# Patient Record
Sex: Female | Born: 1970 | Race: Black or African American | Hispanic: No | Smoking: Never smoker
Health system: Southern US, Community
[De-identification: ages and names within clinical notes are randomized; demographics above are authoritative.]

## PROBLEM LIST (undated history)

## (undated) DIAGNOSIS — R011 Cardiac murmur, unspecified: Secondary | ICD-10-CM

## (undated) DIAGNOSIS — G43909 Migraine, unspecified, not intractable, without status migrainosus: Secondary | ICD-10-CM

## (undated) DIAGNOSIS — N189 Chronic kidney disease, unspecified: Secondary | ICD-10-CM

## (undated) DIAGNOSIS — Z87442 Personal history of urinary calculi: Secondary | ICD-10-CM

## (undated) HISTORY — DX: Migraine, unspecified, not intractable, without status migrainosus: G43.909

## (undated) HISTORY — PX: KIDNEY STONE SURGERY: SHX686

## (undated) HISTORY — DX: Personal history of urinary calculi: Z87.442

## (undated) HISTORY — PX: WISDOM TOOTH EXTRACTION: SHX21

## (undated) HISTORY — DX: Cardiac murmur, unspecified: R01.1

---

## 2009-02-18 HISTORY — PX: BREAST SURGERY: SHX581

## 2014-02-03 ENCOUNTER — Encounter: Payer: Self-pay | Admitting: Internal Medicine

## 2014-02-03 ENCOUNTER — Ambulatory Visit (INDEPENDENT_AMBULATORY_CARE_PROVIDER_SITE_OTHER): Payer: BC Managed Care – PPO | Admitting: Internal Medicine

## 2014-02-03 VITALS — BP 142/74 | HR 78 | Temp 98.1°F | Ht 63.0 in | Wt 159.0 lb

## 2014-02-03 DIAGNOSIS — R030 Elevated blood-pressure reading, without diagnosis of hypertension: Secondary | ICD-10-CM | POA: Insufficient documentation

## 2014-02-03 DIAGNOSIS — Z1239 Encounter for other screening for malignant neoplasm of breast: Secondary | ICD-10-CM

## 2014-02-03 DIAGNOSIS — L0231 Cutaneous abscess of buttock: Secondary | ICD-10-CM

## 2014-02-03 DIAGNOSIS — G43909 Migraine, unspecified, not intractable, without status migrainosus: Secondary | ICD-10-CM | POA: Insufficient documentation

## 2014-02-03 DIAGNOSIS — K219 Gastro-esophageal reflux disease without esophagitis: Secondary | ICD-10-CM

## 2014-02-03 DIAGNOSIS — G43919 Migraine, unspecified, intractable, without status migrainosus: Secondary | ICD-10-CM

## 2014-02-03 MED ORDER — SULFAMETHOXAZOLE-TRIMETHOPRIM 800-160 MG PO TABS: 1.0000 | ORAL_TABLET | Freq: Two times a day (BID) | ORAL | Status: DC

## 2014-02-03 NOTE — Progress Notes (Signed)
HPI  Pt presents to the clinic today to establish care. She moved from [place] in 2012.  Flu: wants one today Tetanus: 2013 LMP: 01/2014 Pap Smear: 2012 Mammogram: 2012 Vision Screening: yearly Dentist: biannually  Elevated blood pressure: She has noticed that her blood pressure has been slightly elevated. She denies chest pain, dizziness or shortness of breath.  GERD: Just started up in the last 6 months. Worse at night if she lays down right after eating. She is not taking any medication OTC.  Migraines: They come with stress. She has not had a migraine in a few years. When they do occur, she will take Ibuprofen and sleep in a dark room.  She does have some concerns today about a boil on her left buttock. It did open up and drain which relieved a lot of the pain. It is still pretty red around it and tender. She denies fever or chills.   Past Medical History  Diagnosis Date  . GERD (gastroesophageal reflux disease)   . Migraines   . Heart murmur   . History of kidney stones     No current outpatient prescriptions on file.   No current facility-administered medications for this visit.    No Known Allergies  Family History  Problem Relation Age of Onset  . Atrial fibrillation Mother   . Alcohol abuse Father   . Drug abuse Father   . Heart disease Father   . Hypertension Father   . COPD Sister     Breast  . Cancer Maternal Aunt     Breast    History   Social History  . Marital Status: Divorced    Spouse Name: N/A    Number of Children: N/A  . Years of Education: N/A   Occupational History  . Not on file.   Social History Main Topics  . Smoking status: Never Smoker   . Smokeless tobacco: Never Used  . Alcohol Use: 0.0 oz/week    0 Not specified per week     Comment: occasional  . Drug Use: Not on file  . Sexual Activity: Not on file   Other Topics Concern  . Not on file   Social History Narrative  . No narrative on file    ROS:  Constitutional:  Denies fever, malaise, fatigue, headache or abrupt weight changes.  HEENT: Denies eye pain, eye redness, ear pain, ringing in the ears, wax buildup, runny nose, nasal congestion, bloody nose, or sore throat. Respiratory: Denies difficulty breathing, shortness of breath, cough or sputum production.   Cardiovascular: Denies chest pain, chest tightness, palpitations or swelling in the hands or feet.  Gastrointestinal: Pt reflux. Denies abdominal pain, bloating, constipation, diarrhea or blood in the stool.  GU: Denies frequency, urgency, pain with urination, blood in urine, odor or discharge. Musculoskeletal: Denies decrease in range of motion, difficulty with gait, muscle pain or joint pain and swelling.  Skin: Pt reports boil on buttock. Denies rashes or ulcercations.  Neurological: Denies dizziness, difficulty with memory, difficulty with speech or problems with balance and coordination.   No other specific complaints in a complete review of systems (except as listed in HPI above).  PE:  Ht 5\' 3"  (1.6 m)  Wt 159 lb (72.122 kg)  BMI 28.17 kg/m2  LMP 01/28/2014 Wt Readings from Last 3 Encounters:  02/03/14 159 lb (72.122 kg)    General: Appears her stated age, well developed, well nourished in NAD. Skin: 1 cm open abscess noted of left buttock, with  surrounding cellulitis. Cardiovascular: Normal rate and rhythm. S1,S2 noted.  No murmur, rubs or gallops noted.  Pulmonary/Chest: Normal effort and positive vesicular breath sounds. No respiratory distress. No wheezes, rales or ronchi noted.  Abdomen: Soft and nontender. Normal bowel sounds, no bruits noted. No distention or masses noted.  Neurological: Alert and oriented.     Assessment and Plan:  Abscess of left buttock:  Already draining eRx for Septra x 7 days for cellulitis Warm compresses TID to encourage it to drain Ibuprofen for pain  She left without getting her flu shot Screening mammogram ordered  RTC when convenient  for her physical exam

## 2014-02-03 NOTE — Progress Notes (Signed)
Pre visit review using our clinic review tool, if applicable. No additional management support is needed unless otherwise documented below in the visit note. 

## 2014-02-03 NOTE — Assessment & Plan Note (Signed)
Will monitor for now Encouraged low sodium diet

## 2014-02-03 NOTE — Assessment & Plan Note (Signed)
She is controlling this with diet an position changes She will let me know if this gets worse

## 2014-02-03 NOTE — Assessment & Plan Note (Signed)
Stress related Has not had one in a while Will monitor for now She will let me know if this gets worse

## 2014-02-03 NOTE — Patient Instructions (Signed)

## 2014-05-19 ENCOUNTER — Ambulatory Visit (INDEPENDENT_AMBULATORY_CARE_PROVIDER_SITE_OTHER)
Admission: RE | Admit: 2014-05-19 | Discharge: 2014-05-19 | Disposition: A | Discharge status: Home / Self Care | Payer: BC Managed Care – PPO | Source: Ambulatory Visit | Attending: Family Medicine | Admitting: Family Medicine

## 2014-05-19 ENCOUNTER — Encounter: Payer: Self-pay | Admitting: Family Medicine

## 2014-05-19 ENCOUNTER — Other Ambulatory Visit: Payer: Self-pay | Admitting: Family Medicine

## 2014-05-19 ENCOUNTER — Ambulatory Visit (INDEPENDENT_AMBULATORY_CARE_PROVIDER_SITE_OTHER): Payer: BC Managed Care – PPO | Admitting: Family Medicine

## 2014-05-19 VITALS — BP 114/80 | HR 70 | Temp 98.0°F | Wt 154.8 lb

## 2014-05-19 DIAGNOSIS — K118 Other diseases of salivary glands: Secondary | ICD-10-CM

## 2014-05-19 DIAGNOSIS — L040 Acute lymphadenitis of face, head and neck: Secondary | ICD-10-CM

## 2014-05-19 MED ORDER — CLINDAMYCIN HCL 300 MG PO CAPS: 300.0000 mg | ORAL_CAPSULE | Freq: Four times a day (QID) | ORAL | Status: DC

## 2014-05-19 MED ORDER — IOHEXOL 300 MG/ML  SOLN
75.0000 mL | Freq: Once | INTRAMUSCULAR | Status: AC | PRN
  Administered 2014-05-19: 75 mL via INTRAVENOUS

## 2014-05-19 MED ORDER — OXYCODONE HCL 5 MG PO TABS: 5.0000 mg | ORAL_TABLET | ORAL | Status: DC | PRN

## 2014-05-19 NOTE — Patient Instructions (Signed)
Good to see you. Please stop by to see Shirlee LimerickMarion on your way out. Take Clindamycin as directed.  Oxycodone as needed for pain (please dont drive after taking this) and Alleve twice daily.  I will call you with your CT results as soon as we get them.

## 2014-05-19 NOTE — Progress Notes (Signed)
Subjective:   Patient ID: Alexandria Contreras, female    DOB: 06-24-1970,Alexandria Contreras   MRN: [id number]  Alexandria Contreras is a pleasant 44 y.o. year old female pt of Nicki Reaper, new to me, who presents to clinic today with Adenopathy  on 05/19/2014  HPI:  Noticed that she was sore behind her left ear 1 week ago. Area continued to bother her but did not feel that swollen. Yesterday,noticed left cervical lymph node was enlarged and painful.  Woke up this morning with severe left sided neck pain and swelling.  Ibuprofen not helping.  No fevers.  No chills.  No sore throat.  Never had anything like this before.  No current outpatient prescriptions on file prior to visit.   No current facility-administered medications on file prior to visit.    No Known Allergies  Past Medical History  Diagnosis Date  . GERD (gastroesophageal reflux disease)   . Migraines   . Heart murmur   . History of kidney stones     Past Surgical History  Procedure Laterality Date  . Breast surgery Right 2011  . Cesarean section    . Wisdom tooth extraction      Family History  Problem Relation Age of Onset  . Atrial fibrillation Mother   . Alcohol abuse Father   . Drug abuse Father   . Heart disease Father   . Hypertension Father   . Cancer Sister     breast  . Cancer Maternal Aunt     Breast    History   Social History  . Marital Status: Divorced    Spouse Name: N/A  . Number of Children: N/A  . Years of Education: N/A   Occupational History  . Not on file.   Social History Main Topics  . Smoking status: Never Smoker   . Smokeless tobacco: Never Used  . Alcohol Use: 0.0 oz/week    0 Standard drinks or equivalent per week     Comment: occasional  . Drug Use: No  . Sexual Activity: Not Currently   Other Topics Concern  . Not on file   Social History Narrative   The PMH, PSH, Social History, Family History, Medications, and allergies have been reviewed in [facility] and have been updated if  relevant.   Review of Systems  Constitutional: Negative for fever.  HENT: Positive for facial swelling, trouble swallowing and voice change. Negative for ear pain, sinus pressure, sneezing, sore throat and tinnitus.   Eyes: Negative.   Cardiovascular: Negative.   Gastrointestinal: Negative.   Endocrine: Negative.   Musculoskeletal: Negative.   Neurological: Negative.   Hematological: Positive for adenopathy.  All other systems reviewed and are negative.      Objective:    BP 114/80 mmHg  Pulse 70  Temp(Src) 98 F (36.7 C) (Oral)  Wt 154 lb 12 oz (70.194 kg)  SpO2 98%  LMP 04/30/2014   Physical Exam  Constitutional: She is oriented to person, place, and time. She appears well-developed and well-nourished. No distress.  HENT:  Head: Normocephalic.  Eyes: Conjunctivae are normal.  Neck: Normal range of motion.  Cardiovascular: Normal rate.   Pulmonary/Chest: Effort normal and breath sounds normal.  Lymphadenopathy:    She has cervical adenopathy.       Left cervical: Superficial cervical, deep cervical and posterior cervical adenopathy present.  Entire left cervical node change feels firm, non fluctuant, enlarged, very painful to plapation  Neurological: She is alert and oriented to person, place,  and time.  Skin: Skin is warm and dry.  Psychiatric: She has a normal mood and affect. Her behavior is normal. Judgment and thought content normal.  Nursing note and vitals reviewed.         Assessment & Plan:   Cervical lymph node abscess - Plan: CT Soft Tissue Neck W Contrast No Follow-up on file.

## 2014-05-19 NOTE — Progress Notes (Signed)
Pre visit review using our clinic review tool, if applicable. No additional management support is needed unless otherwise documented below in the visit note. 

## 2014-05-19 NOTE — Assessment & Plan Note (Addendum)
New- unclear what the source truly is given degree of swelling- ?parotid along with lymph nodes. CT of neck stat. Start clindamycin 300 mg four times daily x 10 days- may need ENT immediate referral depending on CT results. Oxycodone rx given for severe pain. She is a Engineer, civil (consulting)nurse and aware that she should go to ER if symptoms deteriorate or if she spikes a temperature.

## 2014-06-29 MED ORDER — HYDROMORPHONE HCL 1 MG/ML IJ SOLN
INTRAMUSCULAR | Status: AC
  Filled 2014-06-29: qty 1

## 2014-06-29 MED ORDER — ONDANSETRON HCL 4 MG/2ML IJ SOLN
INTRAMUSCULAR | Status: AC
  Filled 2014-06-29: qty 2

## 2014-06-30 ENCOUNTER — Inpatient Hospital Stay: Admission: RE | Admit: 2014-06-30 | Payer: BC Managed Care – PPO | Source: Ambulatory Visit

## 2014-06-30 ENCOUNTER — Encounter: Payer: Self-pay | Admitting: *Deleted

## 2014-06-30 DIAGNOSIS — R591 Generalized enlarged lymph nodes: Secondary | ICD-10-CM | POA: Diagnosis not present

## 2014-06-30 DIAGNOSIS — Z87442 Personal history of urinary calculi: Secondary | ICD-10-CM | POA: Diagnosis not present

## 2014-06-30 DIAGNOSIS — K118 Other diseases of salivary glands: Secondary | ICD-10-CM | POA: Diagnosis present

## 2014-06-30 DIAGNOSIS — Z90710 Acquired absence of both cervix and uterus: Secondary | ICD-10-CM | POA: Diagnosis not present

## 2014-06-30 DIAGNOSIS — R51 Headache: Secondary | ICD-10-CM | POA: Diagnosis not present

## 2014-07-07 ENCOUNTER — Ambulatory Visit: Payer: BC Managed Care – PPO | Admitting: Anesthesiology

## 2014-07-07 ENCOUNTER — Encounter
Admission: RE | Disposition: A | Discharge status: Home / Self Care | Payer: Self-pay | Source: Ambulatory Visit | Attending: Otolaryngology

## 2014-07-07 ENCOUNTER — Encounter: Payer: Self-pay | Admitting: Otolaryngology

## 2014-07-07 ENCOUNTER — Ambulatory Visit
Admission: RE | Admit: 2014-07-07 | Discharge: 2014-07-07 | Disposition: A | Discharge status: Home / Self Care | Payer: BC Managed Care – PPO | Source: Ambulatory Visit | Attending: Otolaryngology | Admitting: Otolaryngology

## 2014-07-07 DIAGNOSIS — K118 Other diseases of salivary glands: Secondary | ICD-10-CM | POA: Insufficient documentation

## 2014-07-07 DIAGNOSIS — R591 Generalized enlarged lymph nodes: Secondary | ICD-10-CM | POA: Insufficient documentation

## 2014-07-07 DIAGNOSIS — R51 Headache: Secondary | ICD-10-CM | POA: Insufficient documentation

## 2014-07-07 DIAGNOSIS — Z87442 Personal history of urinary calculi: Secondary | ICD-10-CM | POA: Insufficient documentation

## 2014-07-07 DIAGNOSIS — Z90710 Acquired absence of both cervix and uterus: Secondary | ICD-10-CM | POA: Insufficient documentation

## 2014-07-07 HISTORY — PX: FINE NEEDLE ASPIRATION: SHX6590

## 2014-07-07 HISTORY — DX: Chronic kidney disease, unspecified: N18.9

## 2014-07-07 LAB — POCT PREGNANCY, URINE: PREG TEST UR: NEGATIVE

## 2014-07-07 SURGERY — FINE NEEDLE ASPIRATION
Anesthesia: General | Laterality: Left | Wound class: Clean

## 2014-07-07 MED ORDER — OXYMETAZOLINE HCL 0.05 % NA SOLN
NASAL | Status: AC
  Filled 2014-07-07: qty 15

## 2014-07-07 MED ORDER — FAMOTIDINE 20 MG PO TABS
20.0000 mg | ORAL_TABLET | Freq: Once | ORAL | Status: AC
  Administered 2014-07-07: 20 mg via ORAL

## 2014-07-07 MED ORDER — LACTATED RINGERS IV SOLN
INTRAVENOUS | Status: DC
  Administered 2014-07-07: 06:00:00 via INTRAVENOUS

## 2014-07-07 MED ORDER — LIDOCAINE-EPINEPHRINE 1 %-1:100000 IJ SOLN
INTRAMUSCULAR | Status: DC | PRN
  Administered 2014-07-07: 2 mL

## 2014-07-07 MED ORDER — HYDROCODONE-ACETAMINOPHEN 5-325 MG PO TABS: 1.0000 | ORAL_TABLET | ORAL | Status: AC | PRN

## 2014-07-07 MED ORDER — LIDOCAINE-EPINEPHRINE 1 %-1:100000 IJ SOLN
INTRAMUSCULAR | Status: AC
  Filled 2014-07-07: qty 1

## 2014-07-07 MED ORDER — FAMOTIDINE 20 MG PO TABS
ORAL_TABLET | ORAL | Status: AC
  Administered 2014-07-07: 20 mg via ORAL
  Filled 2014-07-07: qty 1

## 2014-07-07 SURGICAL SUPPLY — 45 items
ADHESIVE MASTISOL STRL (MISCELLANEOUS) IMPLANT
BLADE SURG 15 STRL LF DISP TIS (BLADE) IMPLANT
BLADE SURG 15 STRL SS (BLADE)
BNDG ADH 2 X3.75 FABRIC TAN LF (GAUZE/BANDAGES/DRESSINGS) ×3 IMPLANT
CANISTER SUCT 1200ML W/VALVE (MISCELLANEOUS) IMPLANT
CLOSURE WOUND 1/4X4 (GAUZE/BANDAGES/DRESSINGS)
CORD BIP STRL DISP 12FT (MISCELLANEOUS) IMPLANT
COTTON BALL STRL MEDIUM (GAUZE/BANDAGES/DRESSINGS) IMPLANT
COVER PROBE FLX POLY STRL (MISCELLANEOUS) ×3 IMPLANT
DRAIN TLS ROUND 10FR (DRAIN) IMPLANT
DRAPE MAG INST 16X20 L/F (DRAPES) IMPLANT
DRAPE SURG 17X11 SM STRL (DRAPES) IMPLANT
DRSG TEGADERM 2-3/8X2-3/4 SM (GAUZE/BANDAGES/DRESSINGS) IMPLANT
ELECT NEEDLE 20X.3 GREEN (MISCELLANEOUS)
ELECTRODE NEEDLE 20X.3 GREEN (MISCELLANEOUS) IMPLANT
FORCEPS JEWEL BIP 4-3/4 STR (INSTRUMENTS) IMPLANT
GAUZE SPONGE 4X4 12PLY STRL (GAUZE/BANDAGES/DRESSINGS) ×3 IMPLANT
GLOVE BIO SURGEON STRL SZ7.5 (GLOVE) ×6 IMPLANT
GOWN STRL REUS W/ TWL LRG LVL3 (GOWN DISPOSABLE) IMPLANT
GOWN STRL REUS W/TWL LRG LVL3 (GOWN DISPOSABLE)
HARMONIC SCALPEL FOCUS (MISCELLANEOUS) IMPLANT
JACKSON PRATT 7MM (INSTRUMENTS) IMPLANT
KIT RM TURNOVER STRD PROC AR (KITS) IMPLANT
LABEL OR SOLS (LABEL) IMPLANT
LIQUID BAND (GAUZE/BANDAGES/DRESSINGS) IMPLANT
MARKER SKIN W/RULER 31145785 (MISCELLANEOUS) IMPLANT
NEEDLE HYPO 22GX1.5 SAFETY (NEEDLE) ×6 IMPLANT
NEEDLE HYPO 25X1 1.5 SAFETY (NEEDLE) ×21 IMPLANT
NS IRRIG 500ML POUR BTL (IV SOLUTION) IMPLANT
PACK HEAD/NECK (MISCELLANEOUS) IMPLANT
PAD GROUND ADULT SPLIT (MISCELLANEOUS) IMPLANT
PROBE MONO 100X0.75 ELECT 1.9M (MISCELLANEOUS) IMPLANT
RETRACTOR STAY HOOK 5MM (MISCELLANEOUS) IMPLANT
SPONGE KITTNER 5P (MISCELLANEOUS) IMPLANT
SPONGE XRAY 4X4 16PLY STRL (MISCELLANEOUS) IMPLANT
STRIP CLOSURE SKIN 1/4X4 (GAUZE/BANDAGES/DRESSINGS) IMPLANT
SUT PROLENE 5 0 PS 3 (SUTURE) IMPLANT
SUT SILK 0 (SUTURE)
SUT SILK 0 30XBRD TIE 6 (SUTURE) IMPLANT
SUT SILK 2 0 (SUTURE)
SUT SILK 2-0 30XBRD TIE 12 (SUTURE) IMPLANT
SUT VIC AB 4-0 RB1 18 (SUTURE) IMPLANT
SYRINGE 10CC LL (SYRINGE) ×27 IMPLANT
SYSTEM CHEST DRAIN TLS 7FR (DRAIN) IMPLANT
TOWEL OR 17X26 4PK STRL BLUE (TOWEL DISPOSABLE) ×3 IMPLANT

## 2014-07-07 NOTE — Op Note (Signed)
..  07/07/2014  10:49 AM    Alexandria Contreras, [name]  [id number]   Pre-Op Dx: left tail parotid lesion, lymphadenopathy  Post-op Dx: SAME  Proc: Ultrasound guided Fine Needle Aspiration of Left Deep Cervical lymph node  Surg: [facility]  Anes: Local Anesthesia  EBL: 1cc  Comp: None  Indications: Left parotid mass with new onset left cervical lymphadenopathy  Findings: firm deep level 3 lymph node, successful FNA of lymp node with multiple passes for cytology and flow  Description of Procedure: After the patient was identified in hold and the history and physical and consent was reviewed and updated. The patient was marked in the normal fashion. The patient was next taken to the operating room and placed in a supine position on her hospital bed.  The patient was initially set up for superficial parotidectomy, but due to the fact that she had new lymphadenopathy that was not present on prior examination nor CT scan, it was decided to biopsy the lymph node for further treatment planning prior to performing a surgical procedure. The patient's left neck was neck injected with 2cc's of 1% lidocaine with 1:100,000 Epinephrine overlying the cervical lymph node.. The patient was next prepped and draped in a sterile normal fashion.  At this time, an ultrasound was used to locate and show a round hypoechoic lymp node in the left anterior neck.  Under ultrasound guidance, a 25 gauge needle was placed within the substance of the lymph node and multiple passes were made.  A cytopathology tech was on hand for evaluation of the specimen.  Several passes were with little cellularity and some passes showed lymph tissue as well as possible parotid tissue.  Multiple slides were created as well as a full pass was sent for FLOW cytometry.  The patient tolerated the procedure well.  Pressure was held for several minutes on the FNA site and care was transferred to PACU.  Plan: Follow pathology with  lymphoma workup. Follow up next week for post-operative evaluation.  [name]  07/07/2014 10:49 AM

## 2014-07-07 NOTE — H&P (Signed)
  History and Physical paper copy reviewed and updated date of procedure and will be scanned into system.  In the interval last several weeks, the patient has developed multiple lower cervical lymph nodes with a large left firm level 3 lymph node.  The patient's left parotid also now has multiple lobulated lymph nodes/nodules that are new from several weeks ago.  Secondary to this new development and the fact that a superficial parotidectomy would only treat the parotid mass/lymph nodes as well as node plucking a single cervical lymph node if this is squamous cell carcinoma or mucoepidermoid carcinoma, the decision was made to proceed with Ultrasound guided FNA today of the left cervical lymph node.  This will allow for treatment planning and ensure that the surgery the patient requires is the appropriate one.  I discussed this extensively with the patient and family and they understand the pros and cons of proceeding with parotidectomy versus lymph node excision versus fine needle aspiration.  Due to risks and possible need for extensive further surgery with the first two options, we will proceed with FNA.  Cytopathology was contacted and will be available.

## 2014-07-07 NOTE — Anesthesia Preprocedure Evaluation (Deleted)
Anesthesia Evaluation  Patient identified by MRN, date of birth, ID band  Reviewed: Allergy & Precautions, NPO status   History of Anesthesia Complications Negative for: history of anesthetic complications  Airway Mallampati: II       Dental no notable dental hx. (+) Teeth Intact   Pulmonary neg pulmonary ROS,    Pulmonary exam normal       Cardiovascular negative cardio ROS Normal cardiovascular exam- Valvular Problems/MurmursRhythm:Regular Rate:Normal     Neuro/Psych  Headaches,    GI/Hepatic GERD-  Medicated,  Endo/Other  negative endocrine ROS  Renal/GU Renal disease  negative genitourinary   Musculoskeletal negative musculoskeletal ROS (+)   Abdominal Normal abdominal exam  (+)   Peds negative pediatric ROS (+)  Hematology negative hematology ROS (+)   Anesthesia Other Findings   Reproductive/Obstetrics negative OB ROS                             Anesthesia Physical Anesthesia Plan  ASA: II  Anesthesia Plan: General   Post-op Pain Management:    Induction: Intravenous  Airway Management Planned: Oral ETT  Additional Equipment:   Intra-op Plan:   Post-operative Plan: Extubation in OR  Informed Consent: I have reviewed the patients History and Physical, chart, labs and discussed the procedure including the risks, benefits and alternatives for the proposed anesthesia with the patient or authorized representative who has indicated his/her understanding and acceptance.     Plan Discussed with: CRNA and Surgeon  Anesthesia Plan Comments:         Anesthesia Quick Evaluation

## 2014-07-08 ENCOUNTER — Encounter: Payer: Self-pay | Admitting: Otolaryngology

## 2014-07-14 LAB — CYTOLOGY - NON PAP

## 2014-08-08 ENCOUNTER — Encounter: Payer: Self-pay | Admitting: Otolaryngology

## 2017-02-07 IMAGING — CT CT NECK W/ CM
3 of 4 series · 16 of 33 positions shown, 19 images · IV contrast (omnipaque)
Comparison: None.

CLINICAL DATA: LEFT neck abscess? LEFT neck and ear pain and
swelling for 2 days.

EXAM:
CT NECK WITH CONTRAST
TECHNIQUE: Multidetector CT imaging of the neck was performed using the
standard protocol following the bolus administration of intravenous
contrast.
CONTRAST:  75 mL Omnipaque 300.

[Series 3: neck_routine 3.0 b40s st · axial · 0.43mm/px · z∈[+961,+1138]mm · 8 of 75 slices shown, 10 images]
[im 8/75  soft-tissue]
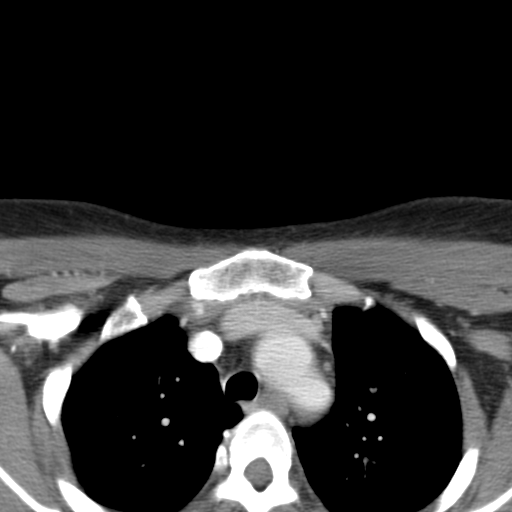
[im 8/75  bone]
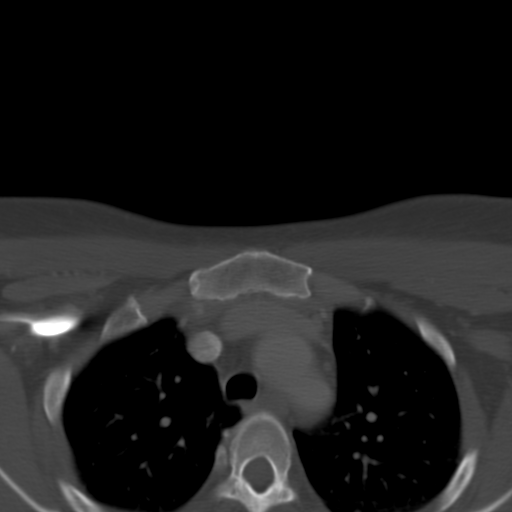
[im 15/75  bone]
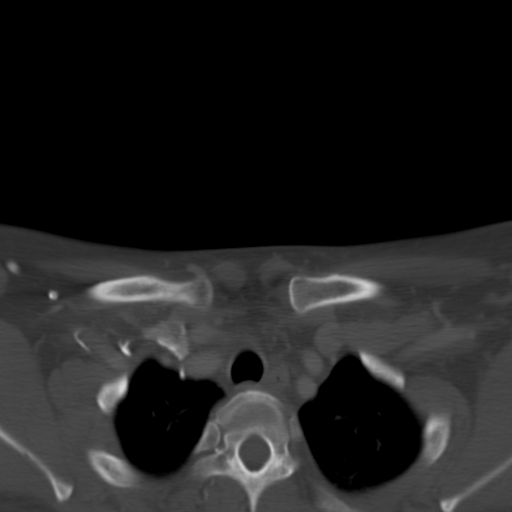
[im 23/75  bone]
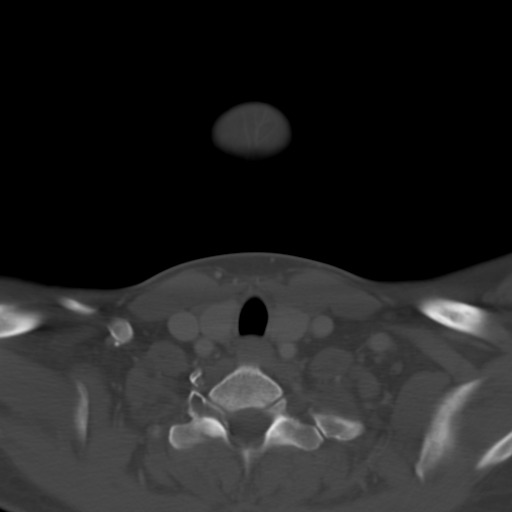
[im 30/75  bone]
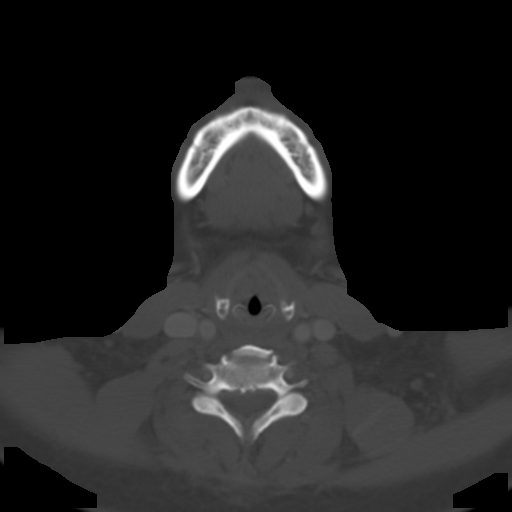
[im 45/75  soft-tissue]
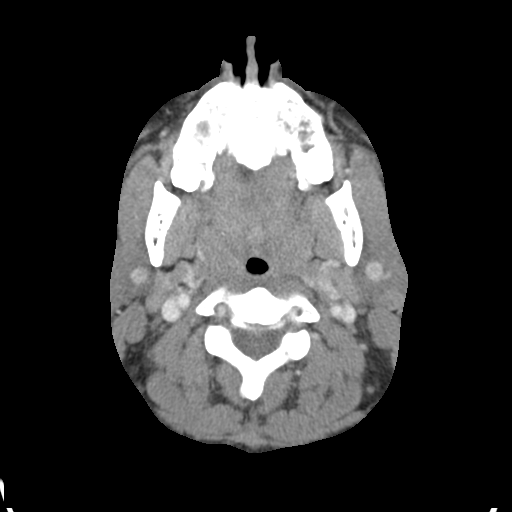
[im 45/75  bone]
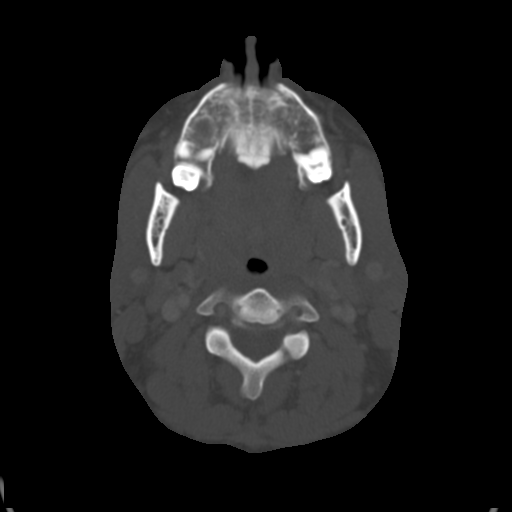
[im 52/75  bone]
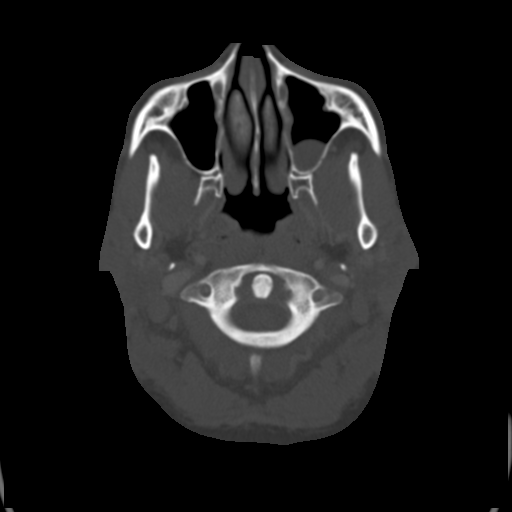
[im 60/75  bone]
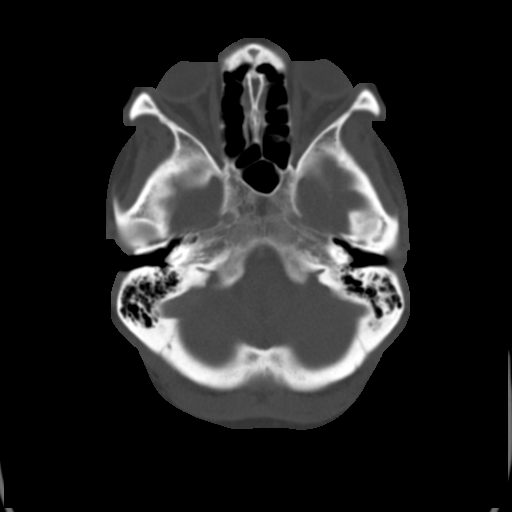
[im 67/75  bone]
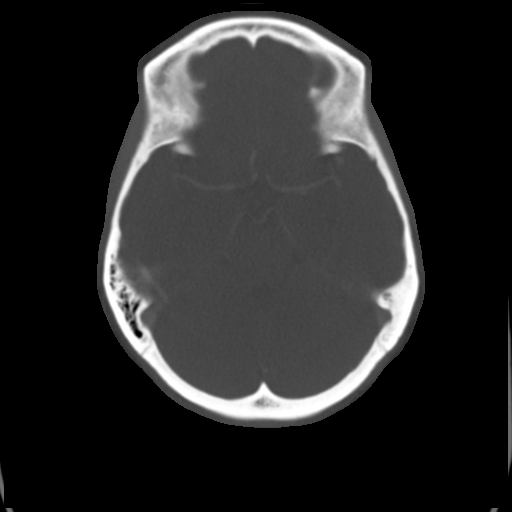

[Series 602: cor · coronal · 0.44mm/px · 3 of 105 slices shown]
[im 21/105  bone]
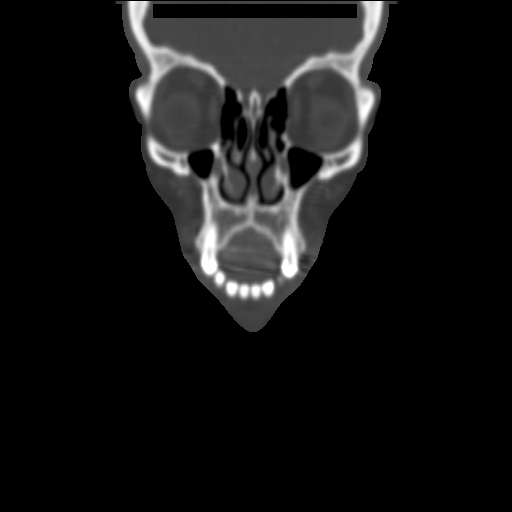
[im 42/105  bone]
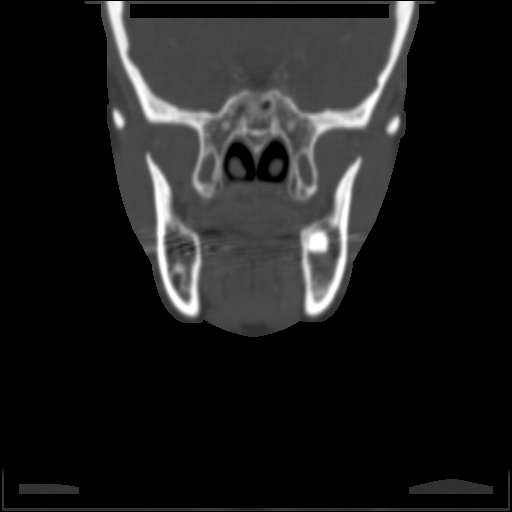
[im 63/105  bone]
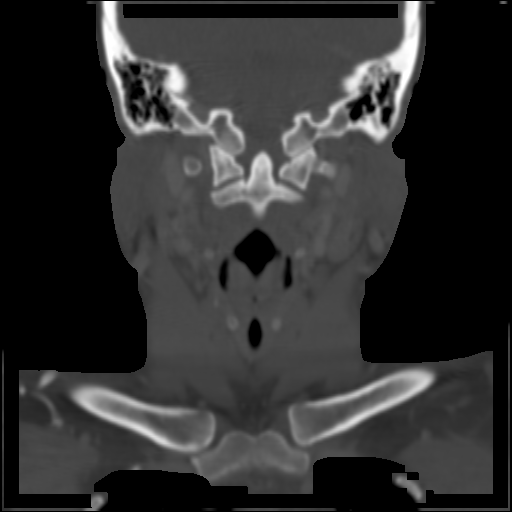

[Series 603: sagittal · sagittal · 0.44mm/px · 5 of 78 slices shown, 6 images]
[im 26/78  bone]
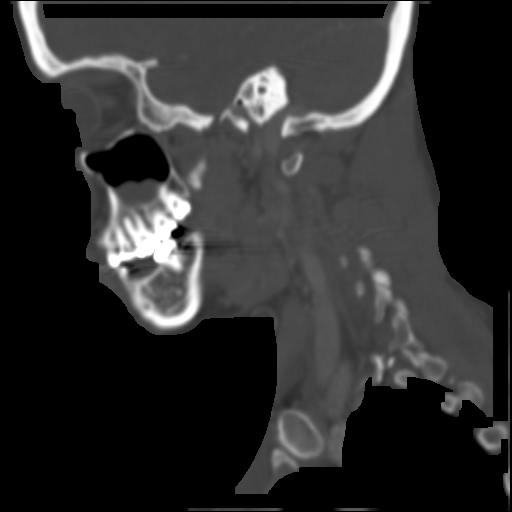
[im 33/78  bone]
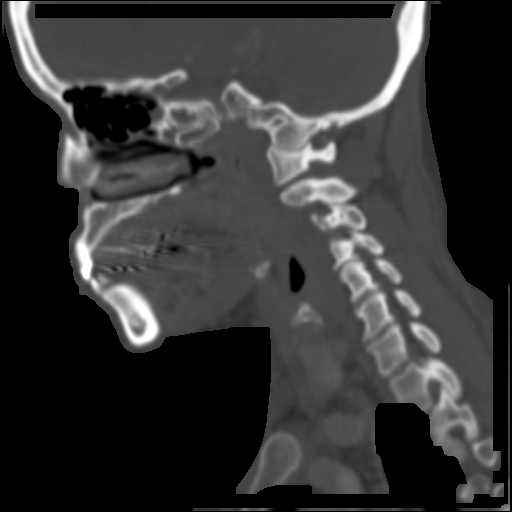
[im 39/78  soft-tissue]
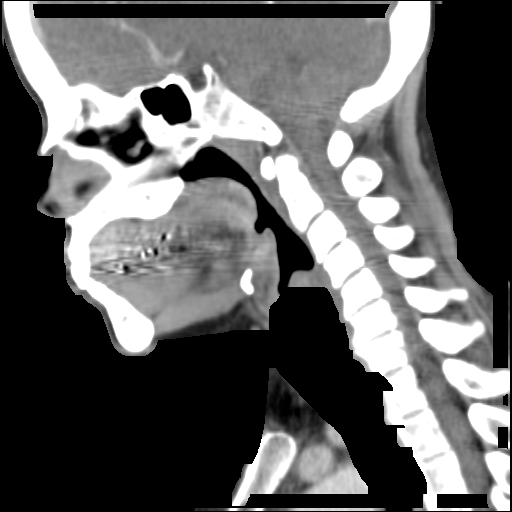
[im 39/78  bone]
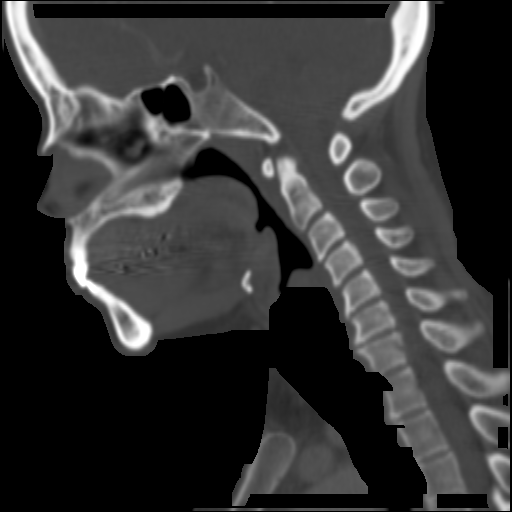
[im 45/78  bone]
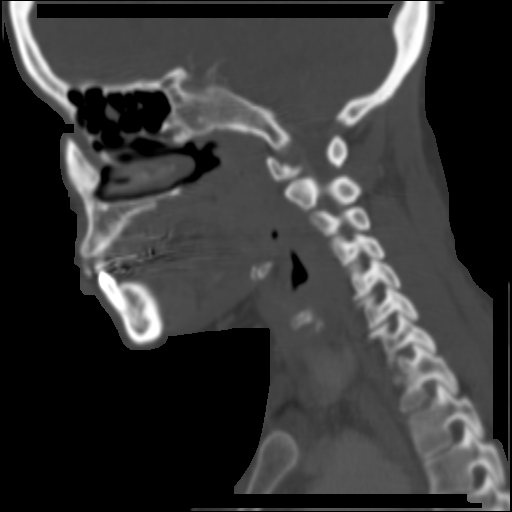
[im 52/78  bone]
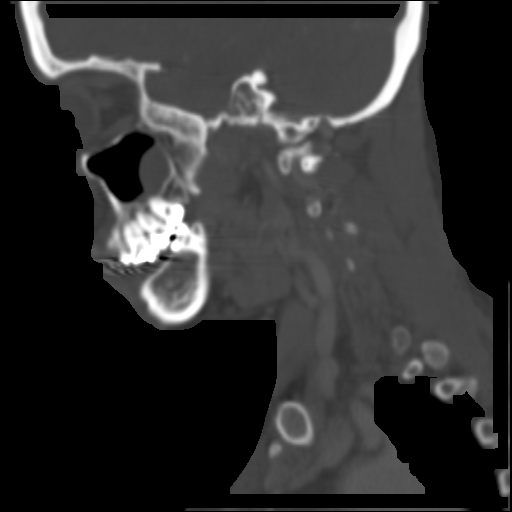

[16 of 33 positions shown; findings below may reference images not displayed]

FINDINGS: Pharynx and larynx: Normal.

Salivary glands: 9 x 13 x 14 mm superficial LEFT parotid lobe
hyperdense solid lesion, retroauricular location concerning for a
primary parotid mass. Benign or malignant processes could have this
appearance. ENT consultation is warranted. No other salivary gland
lesions.

Thyroid: Normal.

Lymph nodes: BILATERAL level IIA lymph nodes mildly enlarged on the
LEFT short axis 11 mm. No III, IV, or V nodes.

Vascular: Carotid bifurcations are widely patent. No jugular
thrombosis. Both vertebrals are patent and equal in size.

Limited intracranial: Negative.

Visualized orbits: Negative.

Mastoids and visualized paranasal sinuses: Minor paranasal sinus
disease.

Skeleton: Spondylosis.  No worrisome osseous lesion.

Upper chest: Lung apices clear.
IMPRESSION: 9 x 13 x 14 superficial LEFT parotid lobe hyperdense lesion,
retroauricular in location, concerning for a primary parotid mass.
Intraparotid lymph node less favored. ENT consultation is warranted.
Mild LEFT level IIA adenopathy.
